# Patient Record
Sex: Female | Born: 1960 | Race: White | Hispanic: No | Marital: Married | Smoking: Never smoker
Health system: Southern US, Community
[De-identification: ages and names within clinical notes are randomized; demographics above are authoritative.]

---

## 1999-11-30 ENCOUNTER — Other Ambulatory Visit: Admission: RE | Admit: 1999-11-30 | Discharge: 1999-11-30 | Payer: Self-pay | Admitting: Obstetrics & Gynecology

## 2001-06-05 ENCOUNTER — Other Ambulatory Visit: Admission: RE | Admit: 2001-06-05 | Discharge: 2001-06-05 | Payer: Self-pay | Admitting: Obstetrics & Gynecology

## 2001-10-06 ENCOUNTER — Encounter (INDEPENDENT_AMBULATORY_CARE_PROVIDER_SITE_OTHER): Payer: Self-pay | Admitting: *Deleted

## 2001-10-06 ENCOUNTER — Ambulatory Visit (HOSPITAL_BASED_OUTPATIENT_CLINIC_OR_DEPARTMENT_OTHER): Admission: RE | Admit: 2001-10-06 | Discharge: 2001-10-06 | Payer: Self-pay | Admitting: Surgery

## 2002-06-01 ENCOUNTER — Ambulatory Visit (HOSPITAL_BASED_OUTPATIENT_CLINIC_OR_DEPARTMENT_OTHER): Admission: RE | Admit: 2002-06-01 | Discharge: 2002-06-01 | Payer: Self-pay | Admitting: Surgery

## 2004-04-19 ENCOUNTER — Inpatient Hospital Stay (HOSPITAL_COMMUNITY): Admission: AD | Admit: 2004-04-19 | Discharge: 2004-04-25 | Payer: Self-pay | Admitting: Psychiatry

## 2004-04-19 ENCOUNTER — Emergency Department (HOSPITAL_COMMUNITY): Admission: EM | Admit: 2004-04-19 | Discharge: 2004-04-19 | Payer: Self-pay | Admitting: *Deleted

## 2004-04-19 ENCOUNTER — Ambulatory Visit: Payer: Self-pay | Admitting: Psychiatry

## 2004-07-03 ENCOUNTER — Other Ambulatory Visit: Admission: RE | Admit: 2004-07-03 | Discharge: 2004-07-03 | Payer: Self-pay | Admitting: Obstetrics & Gynecology

## 2004-07-22 ENCOUNTER — Encounter: Admission: RE | Admit: 2004-07-22 | Discharge: 2004-07-22 | Payer: Self-pay | Admitting: Obstetrics & Gynecology

## 2008-02-07 ENCOUNTER — Encounter: Admission: RE | Admit: 2008-02-07 | Discharge: 2008-02-07 | Payer: Self-pay | Admitting: Family Medicine

## 2010-03-16 IMAGING — US US ABDOMEN COMPLETE
1 series · 14 of 25 positions shown · non-contrast
Comparison: None

CLINICAL DATA: Right upper quadrant pain and burning

ABDOMEN ULTRASOUND
TECHNIQUE: Complete abdominal ultrasound examination was performed
including evaluation of the liver, gallbladder, bile ducts,
pancreas, kidneys, spleen, IVC, and abdominal aorta.

[Series 1: us abdomen complete · 0.28mm/px · 14 of 82 slices shown]
[im 1/82]
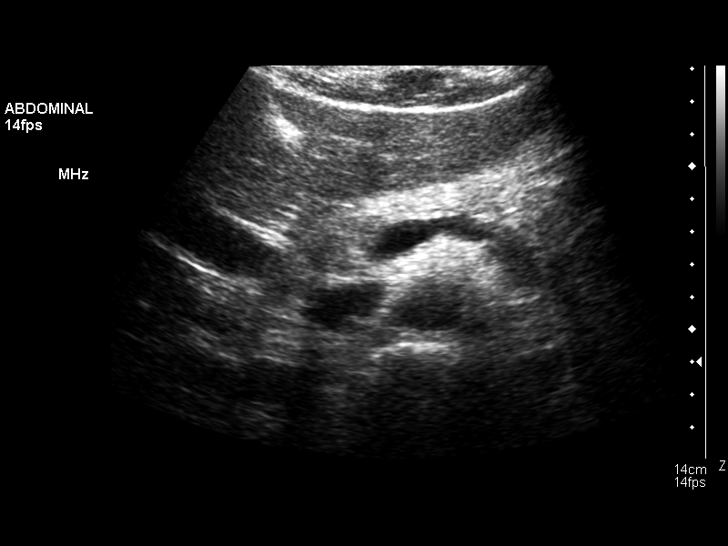
[im 7/82]
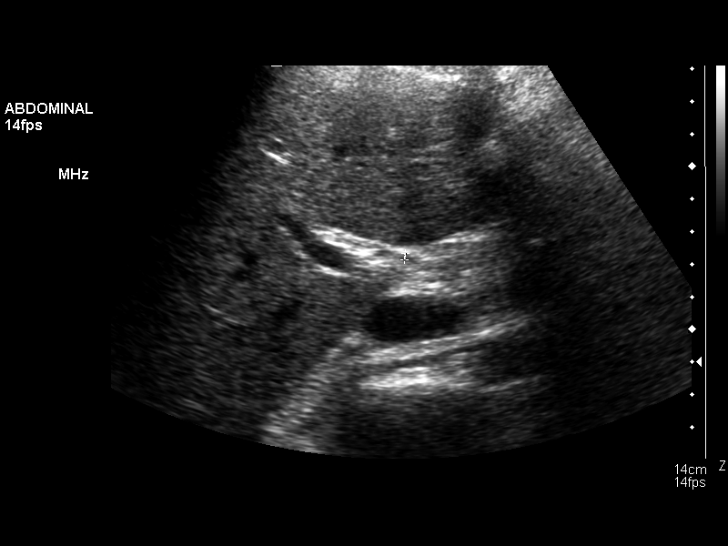
[im 14/82]
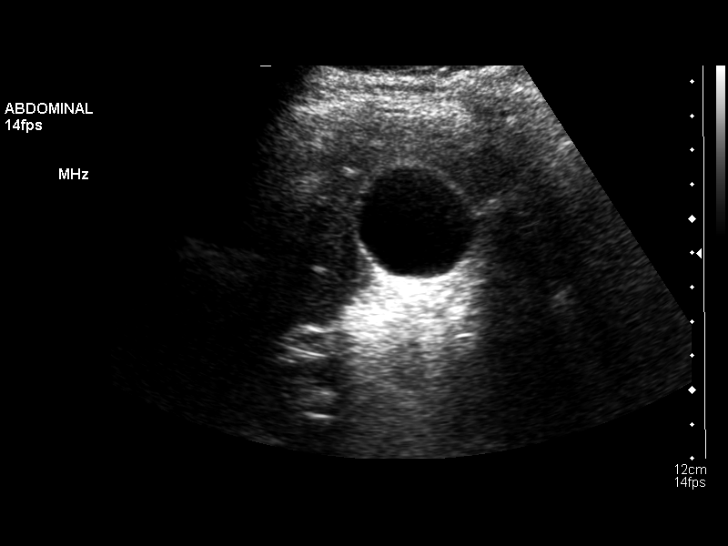
[im 21/82]
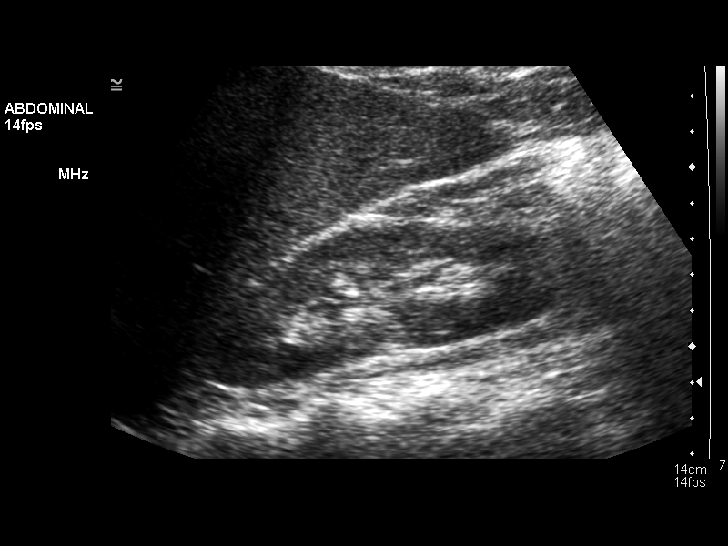
[im 28/82]
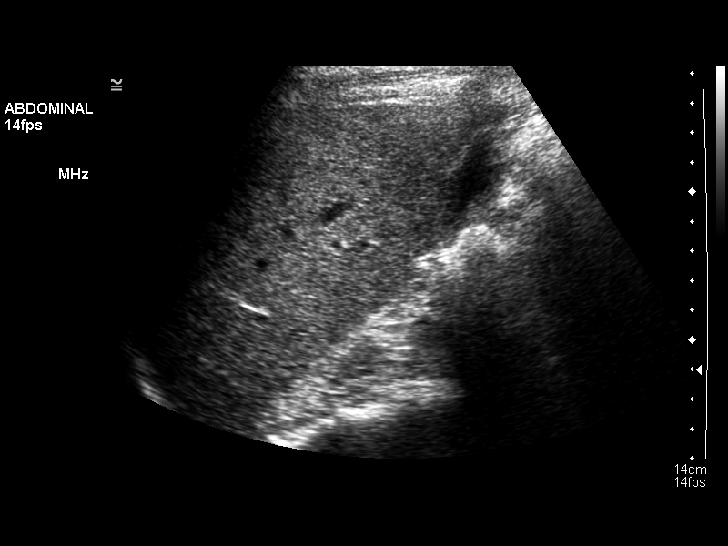
[im 31/82]
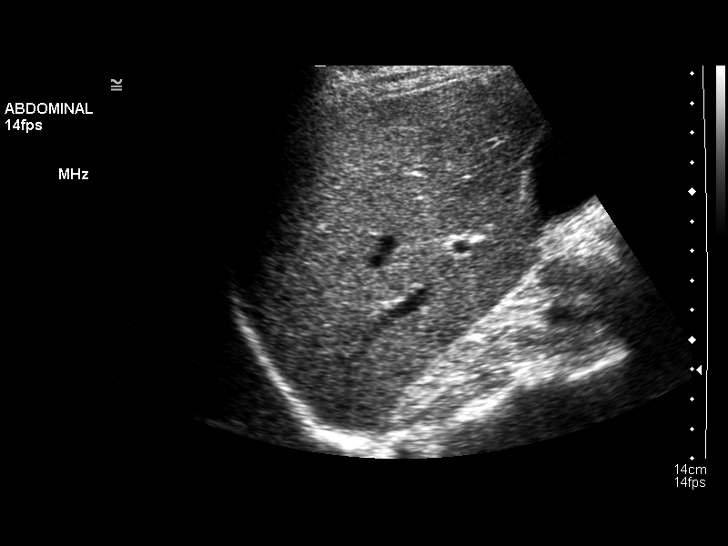
[im 38/82]
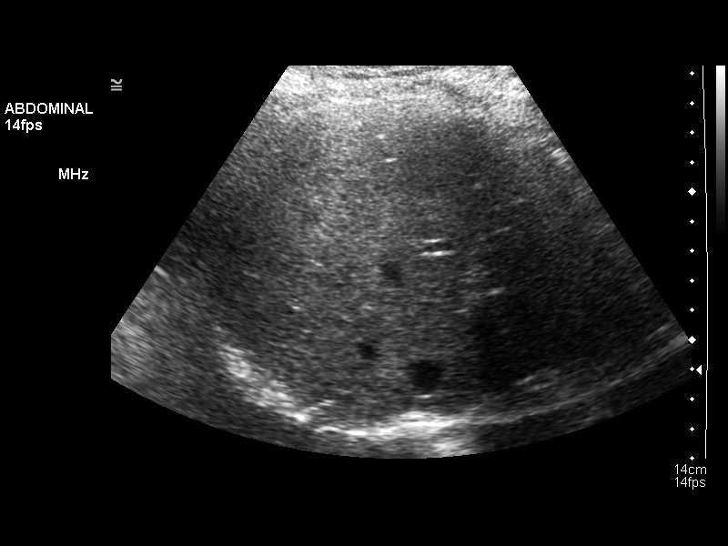
[im 44/82]
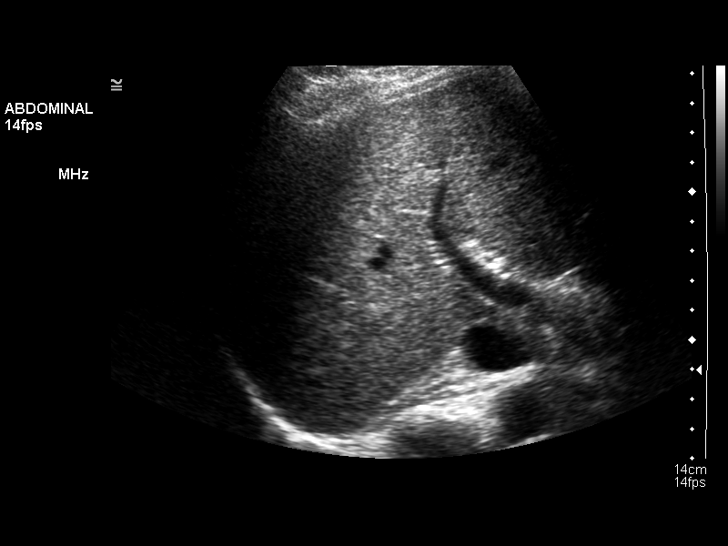
[im 51/82]
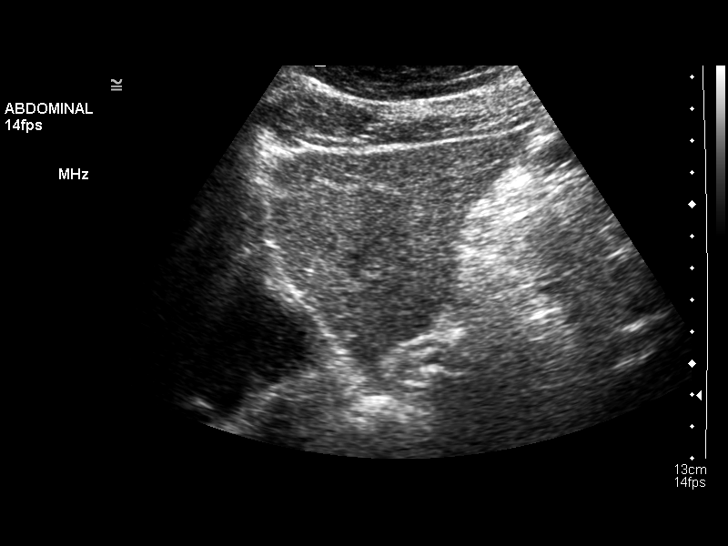
[im 55/82]
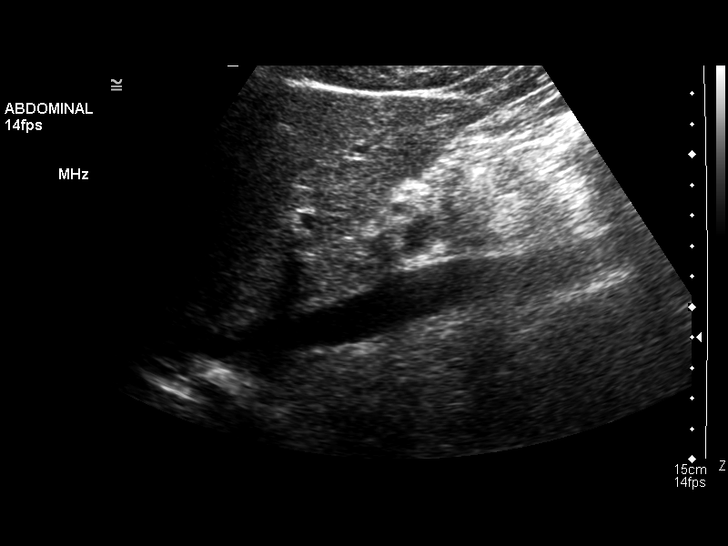
[im 61/82]
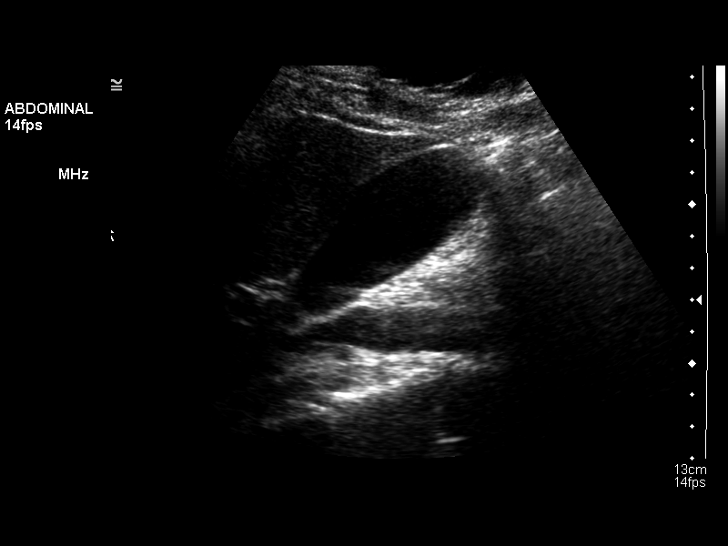
[im 68/82]
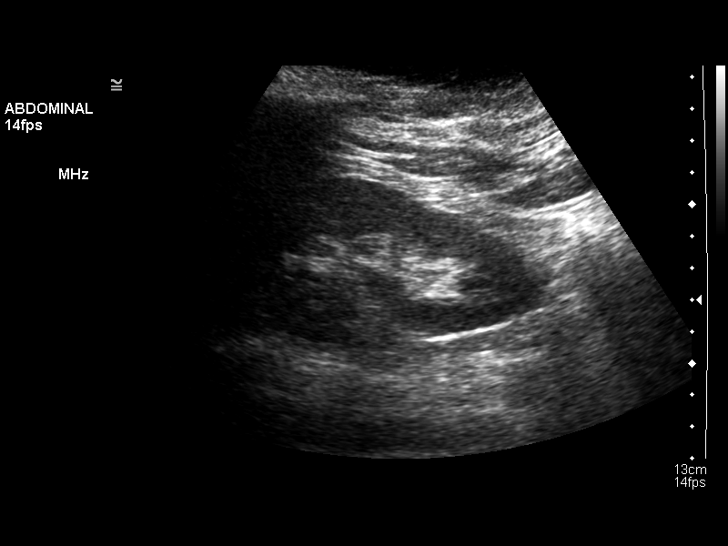
[im 75/82]
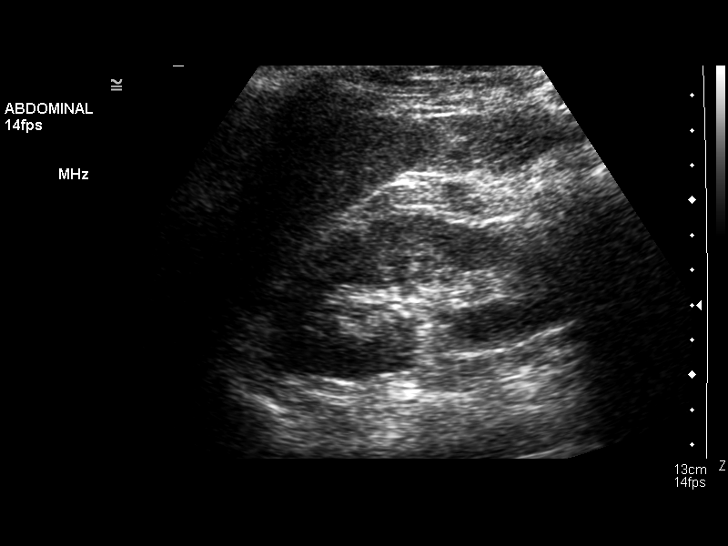
[im 82/82]
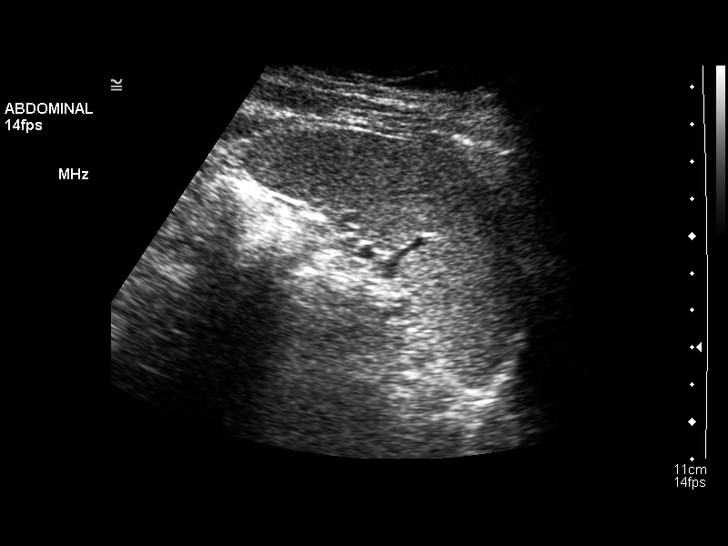

[14 of 25 positions shown; findings below may reference images not displayed]

FINDINGS: Normal gallbladder.  Negative Murphy's sign.  Gallbladder
wall thickness is 1.9 mm.  Common duct measures 2 mm in diameter
which is well within normal limits.  No hepatic, splenic, or
pancreatic abnormality.  There is an echogenic focus in the lateral
aspect of the mid left kidney which would be compatible with a
small benign angiomyolipoma (AML). The spleen measures 9.3 cm in
length.  Both kidneys measure 11.4 cm in length.  Maximum diameter
of the abdominal aorta is 1.7 cm.  Patent IVC.
IMPRESSION: Normal gallbladder.  No biliary ductal dilatation.  Findings
compatible with small AML of the left kidney

## 2010-11-13 NOTE — Op Note (Signed)
[place]  Patient:    [name], [name] Visit Number: [id number] MRN: [id number]          Service Type: DSU Location: [facility] Attending Physician:  Shelly Rubenstein Dictated by:   Abigail Miyamoto, M.D. Proc. Date: 10/06/01 Admit Date:  10/06/2001 Discharge Date: 10/06/2001                             Operative Report  PREOPERATIVE DIAGNOSIS:  Chronic right groin abscess.  POSTOPERATIVE DIAGNOSIS:  Chronic right groin abscess.  PROCEDURE:  Excision of chronic right groin abscess.  SURGEON:  Abigail Miyamoto, M.D.  ANESTHESIA:  Lidocaine 1% and monitored anesthesia care.  ESTIMATED BLOOD LOSS:  Minimal.  PROCEDURE IN DETAIL:  The patient was brought to the operating room, identified as the correct patient.  She was placed upon the operating table, and then anesthesia was induced.  Her right groin was then prepped and draped in the usual sterile fashion.  The skin in the area of the chronic abscess was then anesthetized with 1% lidocaine with epinephrine.  An elliptical incision was then made around the chronically inflamed skin.  Incision was carried down into the subcutaneous tissue with the electrocautery.  Large ellipse of the skin, containing the chronic infection, was then completely excised and sent to pathology for identification.  The wound was then irrigated.  Hemostasis was achieved with the cautery.  The subcutaneous layer was then closed with 2-0 Vicryl sutures.  The skin was closed with a running 4-0 Vicryl suture. Steri-Strips, gauze, and tape were then applied.  The patient tolerated the procedure well.  All sponge, needle, and instrument counts were correct at the end of the procedure.  The patient was then extubated in the operating room and take in stable condition to the recovery room. Dictated by:   Abigail Miyamoto, M.D. Attending Physician:  Shelly Rubenstein DD:  10/06/01 TD:  10/08/01 Job:  [id number]

## 2010-11-13 NOTE — Op Note (Signed)
   NAME:  [name], [name]                       ACCOUNT NO.:  [id number]   MEDICAL RECORD NO.:  [id number]                   PATIENT TYPE:  AMB   LOCATION:  DSC                                  FACILITY:  [facility]   PHYSICIAN:  Abigail Miyamoto, M.D.              DATE OF BIRTH:  July 05, 1960   DATE OF PROCEDURE:  06/01/2002  DATE OF DISCHARGE:                                 OPERATIVE REPORT   PREOPERATIVE DIAGNOSIS:  Chronic right groin abscess.   POSTOPERATIVE DIAGNOSIS:  Chronic right groin abscess.   PROCEDURE:  Re-excision of chronic abscessed tissue, right groin.   SURGEON:  Abigail Miyamoto, M.D.   ANESTHESIA:  1% lidocaine with epinephrine and monitored anesthesia care.   ESTIMATED BLOOD LOSS:  Minimal.   INDICATIONS FOR PROCEDURE:  This is a 50 year old female who presented  earlier in 2003 with a chronic draining abscess in the right groin area in  the inguinal area.  This area was completely excised, and she did well  initially but now has presented with another draining punctum in this area.  Therefore, we proceeded with re-excision.   PROCEDURE IN DETAIL:  The patient was brought to the operating room and  identified as [name].  She was placed supine on the operating room  table, and anesthesia was induced.  Her right groin was then prepped and  draped in the usual sterile fashion.  The skin around the previous scar and  small draining sinus tract was anesthetized with 1% lidocaine.  A wide  elliptical incision was then created with a 15 blade scalpel. The incision  was taken down into the tissue with the electrocautery.  A wide ellipse of  tissue was taken down all the way to the muscle fascia.  The surrounding  tissue was very healthy with no evidence of abscess.  The wound was then  thoroughly irrigated with normal saline.  Hemostasis was achieved with the  cautery.  The subcutaneous layer was then closed with interrupted 3-0 Vicryl  suture, and the skin  was closed with interrupted 3-0 nylon sutures.  Neosporin, gauze and tape were then applied.  The patient tolerated the  procedure well.  All sponge, needle, and instrument counts were correct at  the end of the procedure.  The patient was then extubated in the operating  room and taken stable to the recovery room.                                               Abigail Miyamoto, M.D.    DB/MEDQ  D:  06/01/2002  T:  06/01/2002  Job:  [id number]
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
## 2021-11-21 ENCOUNTER — Encounter (HOSPITAL_BASED_OUTPATIENT_CLINIC_OR_DEPARTMENT_OTHER): Payer: Self-pay | Admitting: Emergency Medicine

## 2021-11-21 ENCOUNTER — Other Ambulatory Visit: Payer: Self-pay

## 2021-11-21 ENCOUNTER — Emergency Department (HOSPITAL_BASED_OUTPATIENT_CLINIC_OR_DEPARTMENT_OTHER)
Admission: EM | Admit: 2021-11-21 | Discharge: 2021-11-21 | Disposition: A | Discharge status: Home / Self Care | Payer: BC Managed Care – PPO | Attending: Emergency Medicine | Admitting: Emergency Medicine

## 2021-11-21 DIAGNOSIS — R1032 Left lower quadrant pain: Secondary | ICD-10-CM | POA: Diagnosis not present

## 2021-11-21 DIAGNOSIS — R197 Diarrhea, unspecified: Secondary | ICD-10-CM | POA: Diagnosis present

## 2021-11-21 DIAGNOSIS — R5383 Other fatigue: Secondary | ICD-10-CM | POA: Diagnosis not present

## 2021-11-21 DIAGNOSIS — R110 Nausea: Secondary | ICD-10-CM | POA: Insufficient documentation

## 2021-11-21 DIAGNOSIS — R5381 Other malaise: Secondary | ICD-10-CM | POA: Diagnosis not present

## 2021-11-21 LAB — C DIFFICILE QUICK SCREEN W PCR REFLEX
C Diff antigen: NEGATIVE
C Diff interpretation: NOT DETECTED
C Diff toxin: NEGATIVE

## 2021-11-21 LAB — COMPREHENSIVE METABOLIC PANEL
ALT: 12 U/L (ref 0–44)
AST: 13 U/L — ABNORMAL LOW (ref 15–41)
Albumin: 4.2 g/dL (ref 3.5–5.0)
Alkaline Phosphatase: 87 U/L (ref 38–126)
Anion gap: 9 (ref 5–15)
BUN: 9 mg/dL (ref 6–20)
CO2: 24 mmol/L (ref 22–32)
Calcium: 8.6 mg/dL — ABNORMAL LOW (ref 8.9–10.3)
Chloride: 106 mmol/L (ref 98–111)
Creatinine, Ser: 0.79 mg/dL (ref 0.44–1.00)
GFR, Estimated: >60 mL/min (ref >60)
Glucose, Bld: 94 mg/dL (ref 70–99)
Potassium: 3.5 mmol/L (ref 3.5–5.1)
Sodium: 139 mmol/L (ref 135–145)
Total Bilirubin: 0.4 mg/dL (ref 0.3–1.2)
Total Protein: 7.2 g/dL (ref 6.5–8.1)

## 2021-11-21 LAB — CBC
HCT: 44.2 % (ref 36.0–46.0)
Hemoglobin: 15 g/dL (ref 12.0–15.0)
MCH: 31.8 pg (ref 26.0–34.0)
MCHC: 33.9 g/dL (ref 30.0–36.0)
MCV: 93.6 fL (ref 80.0–100.0)
Platelets: 273 10*3/uL (ref 150–400)
RBC: 4.72 MIL/uL (ref 3.87–5.11)
RDW: 11.8 % (ref 11.5–15.5)
WBC: 9.6 10*3/uL (ref 4.0–10.5)
nRBC: 0 % (ref 0.0–0.2)

## 2021-11-21 LAB — LIPASE, BLOOD: Lipase: 16 U/L (ref 11–51)

## 2021-11-21 MED ORDER — ONDANSETRON HCL 4 MG/2ML IJ SOLN
4.0000 mg | Freq: Once | INTRAMUSCULAR | Status: AC
  Administered 2021-11-21: 4 mg via INTRAVENOUS
  Filled 2021-11-21: qty 2

## 2021-11-21 NOTE — ED Notes (Signed)
Pt agreeable with d/c plan as discussed by provider- this nurse has verbally reinforced d/c instructions and provided pt with written copy- pt acknowledges verbal understanding and denies any additional questions, concerns, needs- ambulatory at discharge independently with steady gait; no distress; vitals stable.

## 2021-11-21 NOTE — ED Provider Notes (Signed)
[facility] EMERGENCY DEPT Provider Note   CSN: [id number] Arrival date & time: 11/21/21  1722     History  Chief Complaint  Patient presents with   Diarrhea    [name] is a 61 y.o. female with no significant past medical history resents with concern for nausea, diarrhea for around a week and a half.  Patient has had some waxing waning of her symptoms with seeming resolution of most of her diarrhea around Tuesday but she had a milkshake and had return to diarrhea and fatigue and malaise.  Patient reports that she had 2 L of lactated Ringer's by private nurse service the last 2 days which helped to improve some.  She has been taking Reglan, Zofran, Imodium, Pepcid with some relief.  Patient reports that she took a lot of Imodium today and has had seeming improvement of diarrhea since she has been in the emergency department.  She endorses 1 episode several days ago of waking up with brief chest pain, and heart rate of 170 on her Apple watch.  She has not had any other episodes of chest pain or persistent elevated heart rate.  Patient did have a 10-day course of amoxicillin a few weeks prior to onset of symptoms.  She alsoEndorses traveling [place] for a family reunion right before onset of symptoms.  She denies any other sick family members.   Diarrhea     Home Medications Prior to Admission medications   Not on File      Allergies    Aspirin, Ciprofloxacin, and Codeine    Review of Systems   Review of Systems  Constitutional:  Positive for fatigue.  Gastrointestinal:  Positive for diarrhea and nausea.  All other systems reviewed and are negative.  Physical Exam Updated Vital Signs BP 124/87   Pulse 83   Temp 98.4 F (36.9 C)   Resp 13   Wt 78.5 kg   SpO2 99%  Physical Exam Vitals and nursing note reviewed.  Constitutional:      General: She is not in acute distress.    Appearance: Normal appearance.     Comments: Somewhat ill appearing   HENT:     Head: Normocephalic and atraumatic.  Eyes:     General:        Right eye: No discharge.        Left eye: No discharge.  Cardiovascular:     Rate and Rhythm: Normal rate and regular rhythm.     Heart sounds: No murmur heard.   No friction rub. No gallop.  Pulmonary:     Effort: Pulmonary effort is normal.     Breath sounds: Normal breath sounds.  Abdominal:     General: Bowel sounds are normal.     Palpations: Abdomen is soft.     Comments: Very minimal tenderness to palpation of the left lower quadrant, no rebound, rigidity, guarding.  Normal bowel sounds throughout.  No distention noted.  Skin:    General: Skin is warm and dry.     Capillary Refill: Capillary refill takes less than 2 seconds.  Neurological:     Mental Status: She is alert and oriented to person, place, and time.  Psychiatric:        Mood and Affect: Mood normal.        Behavior: Behavior normal.    ED Results / Procedures / Treatments   Labs (all labs ordered are listed, but only abnormal results are displayed) Labs Reviewed  COMPREHENSIVE  METABOLIC PANEL - Abnormal; Notable for the following components:      Result Value   Calcium 8.6 (*)    AST 13 (*)    All other components within normal limits  GASTROINTESTINAL PANEL BY PCR, STOOL (REPLACES STOOL CULTURE)  C DIFFICILE QUICK SCREEN W PCR REFLEX    LIPASE, BLOOD  CBC    EKG None  Radiology No results found.  Procedures Procedures    Medications Ordered in ED Medications  ondansetron (ZOFRAN) injection 4 mg (4 mg Intravenous Given 11/21/21 1929)    ED Course/ Medical Decision Making/ A&P                           Medical Decision Making Amount and/or Complexity of Data Reviewed Labs: ordered.   This patient is a 61 y.o. female who presents to the ED for concern of nausea, abdominal cramping, persistent diarrhea, this involves an extensive number of treatment options, and is a complaint that carries with it a high risk of  complications and morbidity. The emergent differential diagnosis prior to evaluation includes, but is not limited to, diverticulitis, acute mesenteric ischemia, UTI, pyelonephritis, nephrolithiasis, dehydration, infectious diarrhea, C. difficile, enteritis irritable bowel disease versus other.   This is not an exhaustive differential.   Past Medical History / Co-morbidities / Social History: No significant past medical history on file for patient  Physical Exam: Physical exam performed. The pertinent findings include: Patient is somewhat pale and fatigued appearing but overall in no acute distress with stable vital signs.  She has some tenderness palpation of the left lower quadrant without rebound, rigidity, guarding, no significant tenderness throughout the rest of the abdomen.  Normal bowel sounds throughout.  Lab Tests: I ordered, and personally interpreted labs.  The pertinent results include: Lab work is unremarkable at this time with no significant abnormalities on CMP, lipase, CBC.  Patient not having any urinary symptoms at this time, will defer urinalysis.  Based on the high volume of her diarrhea I do have some concern for infectious diarrhea and will send GI pathogen panel as well as C. difficile panel.   Imaging Studies: Considered CT abdomen pelvis based on duration and severity of patient's symptoms, however after physical exam and shared decision making opted to defer at this time.  Patient agrees to this plan.  Medications: I ordered medication including Zofran for nausea. Reevaluation of the patient after these medicines showed that the patient improved. I have reviewed the patients home medicines and have made adjustments as needed.  Patient is feeling overall okay while in the emergency department, denies significant nausea, does feel like she can keep something on her stomach.   Disposition: After consideration of the diagnostic results and the patients response to treatment,  I feel that patient's symptoms are consistent with an infectious diarrhea likely viral in nature, differential includes neurovirus, rotavirus, or other viral diarrhea.  Also considered infectious diarrhea with C. difficile, we will send GI pathogen panel and C. difficile panel.  As patient has overall unremarkable work-up today, is not in acute distress, tolerating p.o, and has stable vital signs and think that she is stable for discharge without results of GI pathogen panel or C. difficile panel.  Discussed if it does come back with C. difficile, she has persistent or worsening symptoms should return to emergency department for further evaluation.  If GI pathogen panel comes back for norovirus, rotavirus or other viral cause of infectious diarrhea  discussed that should resolve over time on its own encouraged continued symptomatic treatment at home.   I discussed this case with my attending physician Dr. Criss AlvineGoldston who cosigned this note including patient's presenting symptoms, physical exam, and planned diagnostics and interventions. Attending physician stated agreement with plan or made changes to plan which were implemented.    Final Clinical Impression(s) / ED Diagnoses Final diagnoses:  Diarrhea of presumed infectious origin    Rx / DC Orders ED Discharge Orders     None         West Balirosperi, Graylyn Bunney H, PA-C 11/21/21 2004    Pricilla LovelessGoldston, Scott, MD 11/24/21 [phone]

## 2021-11-21 NOTE — ED Notes (Signed)
Pt now oob to hall bathroom to attempt to provide stool sample

## 2021-11-21 NOTE — Discharge Instructions (Signed)
Continue to drink plenty of fluids, rest, and follow-up on the results of the GI pathogen screenings that we sent off today on your patient portal.  There should be instructions on how to sign up for patient portal in your discharge instructions.  If you have a viral cause of your diarrhea then it should continue to resolve over the next few days.  I recommend that you stick to a gentle diet in the meantime as tolerated.  If the results come back positive for C. difficile you need to return to the emergency department or contact your doctor for further treatment and management.

## 2021-11-21 NOTE — ED Triage Notes (Signed)
Pt started with nausea about 1 and half week ago. Turned to diarrhea since last Sunday . Pt felt better on Tuesday, but ate milkshake and diarrhea came back. Since then she has had 2 liters LR (private nurse service ), reglan/zofran/immodium/pepcid with no relief. Pt states she also awoke one morning with chest pain and heart rate was 170. Pt is retired Nutritional therapist at [employer]

## 2021-11-23 LAB — GASTROINTESTINAL PANEL BY PCR, STOOL (REPLACES STOOL CULTURE)
